# Patient Record
Sex: Male | Born: 1955 | Race: White | Hispanic: No | Marital: Single | Smoking: Never smoker
Health system: Southern US, Community
[De-identification: ages and names within clinical notes are randomized; demographics above are authoritative.]

## PROBLEM LIST (undated history)

## (undated) DIAGNOSIS — I10 Essential (primary) hypertension: Secondary | ICD-10-CM

## (undated) DIAGNOSIS — G8929 Other chronic pain: Secondary | ICD-10-CM

## (undated) DIAGNOSIS — E079 Disorder of thyroid, unspecified: Secondary | ICD-10-CM

## (undated) DIAGNOSIS — B182 Chronic viral hepatitis C: Secondary | ICD-10-CM

---

## 2005-06-29 ENCOUNTER — Emergency Department (HOSPITAL_COMMUNITY): Admission: EM | Admit: 2005-06-29 | Discharge: 2005-06-29 | Payer: Self-pay | Admitting: Emergency Medicine

## 2008-06-03 ENCOUNTER — Emergency Department (HOSPITAL_BASED_OUTPATIENT_CLINIC_OR_DEPARTMENT_OTHER): Admission: EM | Admit: 2008-06-03 | Discharge: 2008-06-04 | Payer: Self-pay | Admitting: Emergency Medicine

## 2008-06-03 ENCOUNTER — Ambulatory Visit: Payer: Self-pay | Admitting: Diagnostic Radiology

## 2009-01-23 DIAGNOSIS — R760 Raised antibody titer: Secondary | ICD-10-CM | POA: Insufficient documentation

## 2009-03-11 ENCOUNTER — Ambulatory Visit: Payer: Self-pay | Admitting: Gastroenterology

## 2009-04-05 ENCOUNTER — Ambulatory Visit (HOSPITAL_COMMUNITY): Admission: RE | Admit: 2009-04-05 | Discharge: 2009-04-05 | Payer: Self-pay | Admitting: Gastroenterology

## 2010-01-07 ENCOUNTER — Ambulatory Visit: Payer: Self-pay | Admitting: Diagnostic Radiology

## 2010-01-07 ENCOUNTER — Inpatient Hospital Stay (HOSPITAL_COMMUNITY): Admission: EM | Admit: 2010-01-07 | Discharge: 2010-01-14 | Payer: Self-pay | Admitting: Emergency Medicine

## 2010-01-07 ENCOUNTER — Encounter: Payer: Self-pay | Admitting: Emergency Medicine

## 2010-01-13 ENCOUNTER — Ambulatory Visit: Payer: Self-pay | Admitting: Physical Medicine & Rehabilitation

## 2010-05-16 ENCOUNTER — Encounter (HOSPITAL_COMMUNITY)
Admission: RE | Admit: 2010-05-16 | Discharge: 2010-05-16 | Disposition: A | Discharge status: Home / Self Care | Payer: Self-pay | Source: Ambulatory Visit | Attending: Orthopedic Surgery | Admitting: Orthopedic Surgery

## 2010-05-16 DIAGNOSIS — Z01812 Encounter for preprocedural laboratory examination: Secondary | ICD-10-CM | POA: Insufficient documentation

## 2010-05-16 LAB — COMPREHENSIVE METABOLIC PANEL
ALT: 49 U/L (ref 0–53)
AST: 60 U/L — ABNORMAL HIGH (ref 0–37)
Albumin: 3.7 g/dL (ref 3.5–5.2)
Alkaline Phosphatase: 84 U/L (ref 39–117)
CO2: 27 mEq/L (ref 19–32)
Calcium: 9.2 mg/dL (ref 8.4–10.5)
Chloride: 104 mEq/L (ref 96–112)
Creatinine, Ser: 1.44 mg/dL (ref 0.4–1.5)
GFR calc Af Amer: >60 mL/min (ref >60)
GFR calc non Af Amer: 52 mL/min — ABNORMAL LOW (ref >60)
Glucose, Bld: 111 mg/dL — ABNORMAL HIGH (ref 70–99)
Potassium: 4.1 mEq/L (ref 3.5–5.1)
Sodium: 139 mEq/L (ref 135–145)
Total Bilirubin: 1 mg/dL (ref 0.3–1.2)
Total Protein: 6.5 g/dL (ref 6.0–8.3)

## 2010-05-16 LAB — CBC
HCT: 39 % (ref 39.0–52.0)
Hemoglobin: 14 g/dL (ref 13.0–17.0)
MCH: 31.3 pg (ref 26.0–34.0)
MCHC: 35.9 g/dL (ref 30.0–36.0)
Platelets: 111 10*3/uL — ABNORMAL LOW (ref 150–400)
RBC: 4.47 MIL/uL (ref 4.22–5.81)
RDW: 13.1 % (ref 11.5–15.5)

## 2010-05-16 LAB — APTT: aPTT: 32 seconds (ref 24–37)

## 2010-05-16 LAB — URINALYSIS, ROUTINE W REFLEX MICROSCOPIC
Hgb urine dipstick: NEGATIVE
Ketones, ur: 15 mg/dL — AB
Protein, ur: NEGATIVE mg/dL
Specific Gravity, Urine: 1.022 (ref 1.005–1.030)
Urine Glucose, Fasting: NEGATIVE mg/dL
Urobilinogen, UA: 4 mg/dL — ABNORMAL HIGH (ref 0.0–1.0)

## 2010-05-16 LAB — SURGICAL PCR SCREEN: Staphylococcus aureus: POSITIVE — AB

## 2010-05-16 LAB — BILIRUBIN, DIRECT: Bilirubin, Direct: 0.2 mg/dL (ref 0.0–0.3)

## 2010-05-16 LAB — PROTIME-INR
INR: 1.05 (ref 0.00–1.49)
Prothrombin Time: 13.9 seconds (ref 11.6–15.2)

## 2010-05-19 ENCOUNTER — Ambulatory Visit (HOSPITAL_COMMUNITY)
Admission: RE | Admit: 2010-05-19 | Discharge: 2010-05-19 | Disposition: A | Discharge status: Home / Self Care | Payer: Self-pay | Source: Ambulatory Visit | Attending: Orthopedic Surgery | Admitting: Orthopedic Surgery

## 2010-05-19 DIAGNOSIS — Z472 Encounter for removal of internal fixation device: Secondary | ICD-10-CM | POA: Insufficient documentation

## 2010-06-06 NOTE — Op Note (Signed)
William Robles, William Robles                ACCOUNT NO.:  [id number]  MEDICAL RECORD NO.:  [id number]           PATIENT TYPE:  O  LOCATION:  SDSC                         FACILITY:  [facility]  PHYSICIAN:  Vania Rea. Ashlynne Shetterly, M.D.  DATE OF BIRTH:  12-Jul-1955  DATE OF PROCEDURE:  05/19/2010 DATE OF DISCHARGE:  05/19/2010                              OPERATIVE REPORT   PREOPERATIVE DIAGNOSIS:  Retained hardware, right shoulder.  POSTOPERATIVE DIAGNOSIS:  Retained hardware, right shoulder.  PROCEDURE:  Hardware removal right shoulder in the form of a coracoclavicular screw.  SURGEON:  Vania Rea. Jaslin Novitski, MD  ASSISTANT:  Lucita Lora. Shuford, PA-C  ANESTHESIA:  LMA general as well as local.  BLOOD LOSS:  Minimal.  DRAINS:  None.  HISTORY:  [name] is a 55 year old gentleman who sustained a previous severely displaced distal clavicle fracture and underwent an open reduction and internal fixation with a coracoclavicular screw that I performed last fall.  He presents for follow up with complaints of pain related to retained hardware and is brought to the operating room at this time for planned hardware removal.  His radiographs do show some continued lucencies of the distal clavicle fracture site, but clinically it does appear to have solid union the region with no gross instability. In addition, there is some evidence for lysis around the implant suggesting that it has indeed become loose, and he is brought to the operating room at this time for planned hardware removal.  Preoperatively, I had counseled Mr. Schaner on treatment options as well as risks versus benefits thereof.  Possible surgical complications were reviewed including the potential for bleeding, infection, neurovascular injury, persistent pain, loss of motion, anesthetic complication, refracture, further displacement at the repair site, and possible need for additional surgery.  He understands and accepts and agrees with our planned  procedure.  PROCEDURE IN DETAIL:  After undergoing routine preop evaluation, the patient received prophylactic antibiotics.  Placed supine on the operating table, underwent smooth induction of an LMA general anesthesia.  The right shoulder girdle region was sterilely prepped and draped in standard fashion.  Time-out was called.  At this point, the proximal aspect of the skin incision overlying the distal clavicle was infiltrated with 4 mL of 0.5% Marcaine with epinephrine.  The proximal portion of the incision was then opened 2 cm with sharp dissection, carried deeply down to the dorsal aspect of the distal clavicle. Subperiosteal dissection was then performed and readily identified the coracoclavicular screw and the associated washer which were clearly loose.  These were then removed without difficulty utilizing the appropriate DePuy screwdriver.  The wound was then irrigated. Hemostasis was obtained.  Closed with 2-0 Vicryl in the subcu layer and intracuticular 3-0 Monocryl for the skin followed by Steri-Strips.  Additional 0.5% Marcaine with epinephrine was instilled in the peri-incisional soft tissues.  Dry dressing was applied.  The patient was then awakened, extubated, and taken to the recovery room in stable condition.     Vania Rea. Italia Wolfert, M.D.     KMS/MEDQ  D:  05/19/2010  T:  05/20/2010  Job:  [id number]  Electronically Signed  by Caryn Bee Chrsitopher Wik M.D. on 06/06/2010 05:03:03 PM

## 2010-06-16 LAB — URINALYSIS, ROUTINE W REFLEX MICROSCOPIC
Bilirubin Urine: NEGATIVE
Glucose, UA: NEGATIVE mg/dL
Hgb urine dipstick: NEGATIVE
Ketones, ur: NEGATIVE mg/dL
Nitrite: NEGATIVE
Protein, ur: NEGATIVE mg/dL
Specific Gravity, Urine: 1.027 (ref 1.005–1.030)
Urobilinogen, UA: 0.2 mg/dL (ref 0.0–1.0)
pH: 7 (ref 5.0–8.0)

## 2010-06-16 LAB — CBC
HCT: 36.4 % — ABNORMAL LOW (ref 39.0–52.0)
HCT: 37.7 % — ABNORMAL LOW (ref 39.0–52.0)
HCT: 42.6 % (ref 39.0–52.0)
Hemoglobin: 12.7 g/dL — ABNORMAL LOW (ref 13.0–17.0)
Hemoglobin: 13.6 g/dL (ref 13.0–17.0)
Hemoglobin: 14.5 g/dL (ref 13.0–17.0)
MCH: 30.9 pg (ref 26.0–34.0)
MCH: 30.9 pg (ref 26.0–34.0)
MCH: 31.3 pg (ref 26.0–34.0)
MCHC: 34 g/dL (ref 30.0–36.0)
MCHC: 34.9 g/dL (ref 30.0–36.0)
MCHC: 36.1 g/dL — ABNORMAL HIGH (ref 30.0–36.0)
MCV: 86.7 fL (ref 78.0–100.0)
MCV: 88.6 fL (ref 78.0–100.0)
MCV: 91 fL (ref 78.0–100.0)
Platelets: 130 10*3/uL — ABNORMAL LOW (ref 150–400)
Platelets: 136 10*3/uL — ABNORMAL LOW (ref 150–400)
Platelets: 92 10*3/uL — ABNORMAL LOW (ref 150–400)
RBC: 4.11 MIL/uL — ABNORMAL LOW (ref 4.22–5.81)
RBC: 4.35 MIL/uL (ref 4.22–5.81)
RBC: 4.69 MIL/uL (ref 4.22–5.81)
RDW: 13.1 % (ref 11.5–15.5)
RDW: 13.6 % (ref 11.5–15.5)
RDW: 13.8 % (ref 11.5–15.5)
WBC: 6.3 10*3/uL (ref 4.0–10.5)
WBC: 6.6 10*3/uL (ref 4.0–10.5)
WBC: 7.3 10*3/uL (ref 4.0–10.5)

## 2010-06-16 LAB — BASIC METABOLIC PANEL
BUN: 11 mg/dL (ref 6–23)
CO2: 29 mEq/L (ref 19–32)
Calcium: 9.1 mg/dL (ref 8.4–10.5)
Chloride: 101 mEq/L (ref 96–112)
Creatinine, Ser: 1.19 mg/dL (ref 0.4–1.5)
GFR calc Af Amer: >60 mL/min (ref >60)
GFR calc non Af Amer: >60 mL/min (ref >60)
Glucose, Bld: 101 mg/dL — ABNORMAL HIGH (ref 70–99)
Potassium: 4.1 mEq/L (ref 3.5–5.1)
Sodium: 138 mEq/L (ref 135–145)

## 2010-06-16 LAB — COMPREHENSIVE METABOLIC PANEL
ALT: 53 U/L (ref 0–53)
AST: 71 U/L — ABNORMAL HIGH (ref 0–37)
Albumin: 4 g/dL (ref 3.5–5.2)
Alkaline Phosphatase: 96 U/L (ref 39–117)
BUN: 14 mg/dL (ref 6–23)
CO2: 28 mEq/L (ref 19–32)
Calcium: 9.5 mg/dL (ref 8.4–10.5)
Chloride: 104 mEq/L (ref 96–112)
Creatinine, Ser: 1 mg/dL (ref 0.4–1.5)
GFR calc Af Amer: >60 mL/min (ref >60)
GFR calc non Af Amer: >60 mL/min (ref >60)
Glucose, Bld: 131 mg/dL — ABNORMAL HIGH (ref 70–99)
Potassium: 4.4 mEq/L (ref 3.5–5.1)
Sodium: 139 mEq/L (ref 135–145)
Total Bilirubin: 1.3 mg/dL — ABNORMAL HIGH (ref 0.3–1.2)
Total Protein: 7.6 g/dL (ref 6.0–8.3)

## 2010-06-30 ENCOUNTER — Emergency Department (HOSPITAL_BASED_OUTPATIENT_CLINIC_OR_DEPARTMENT_OTHER)
Admission: EM | Admit: 2010-06-30 | Discharge: 2010-06-30 | Disposition: A | Discharge status: Home / Self Care | Attending: Emergency Medicine | Admitting: Emergency Medicine

## 2010-06-30 ENCOUNTER — Emergency Department (INDEPENDENT_AMBULATORY_CARE_PROVIDER_SITE_OTHER)

## 2010-06-30 DIAGNOSIS — M25519 Pain in unspecified shoulder: Secondary | ICD-10-CM | POA: Insufficient documentation

## 2010-06-30 DIAGNOSIS — W06XXXA Fall from bed, initial encounter: Secondary | ICD-10-CM | POA: Insufficient documentation

## 2010-06-30 DIAGNOSIS — T148XXA Other injury of unspecified body region, initial encounter: Secondary | ICD-10-CM | POA: Insufficient documentation

## 2010-06-30 DIAGNOSIS — Y9210 Unspecified residential institution as the place of occurrence of the external cause: Secondary | ICD-10-CM | POA: Insufficient documentation

## 2010-07-14 LAB — CBC
HCT: 42.8 % (ref 39.0–52.0)
MCV: 90.3 fL (ref 78.0–100.0)
RBC: 4.74 MIL/uL (ref 4.22–5.81)
WBC: 8.8 10*3/uL (ref 4.0–10.5)

## 2010-07-14 LAB — DIFFERENTIAL
Eosinophils Absolute: 0.1 10*3/uL (ref 0.0–0.7)
Lymphocytes Relative: 22 % (ref 12–46)
Lymphs Abs: 1.9 10*3/uL (ref 0.7–4.0)
Monocytes Relative: 10 % (ref 3–12)
Neutrophils Relative %: 67 % (ref 43–77)

## 2010-07-14 LAB — BASIC METABOLIC PANEL
BUN: 24 mg/dL — ABNORMAL HIGH (ref 6–23)
CO2: 23 mEq/L (ref 19–32)
Calcium: 9.1 mg/dL (ref 8.4–10.5)
Chloride: 107 mEq/L (ref 96–112)
Creatinine, Ser: 1.5 mg/dL (ref 0.4–1.5)
GFR calc Af Amer: 60 mL/min — ABNORMAL LOW (ref >60)
GFR calc non Af Amer: 50 mL/min — ABNORMAL LOW (ref >60)
Glucose, Bld: 96 mg/dL (ref 70–99)
Potassium: 5.3 mEq/L — ABNORMAL HIGH (ref 3.5–5.1)
Sodium: 143 mEq/L (ref 135–145)

## 2010-07-14 LAB — POCT TOXICOLOGY PANEL: Barbiturates: POSITIVE

## 2010-07-14 LAB — ETHANOL: Alcohol, Ethyl (B): <5 mg/dL (ref 0–10)

## 2010-07-14 LAB — ACETAMINOPHEN LEVEL: Acetaminophen (Tylenol), Serum: <10 ug/mL — ABNORMAL LOW (ref 10–30)

## 2010-07-14 LAB — GLUCOSE, CAPILLARY

## 2011-01-02 DEATH — deceased

## 2011-09-07 ENCOUNTER — Ambulatory Visit: Admitting: Gastroenterology

## 2011-10-19 ENCOUNTER — Ambulatory Visit (INDEPENDENT_AMBULATORY_CARE_PROVIDER_SITE_OTHER): Payer: Self-pay | Admitting: Gastroenterology

## 2011-10-19 DIAGNOSIS — B182 Chronic viral hepatitis C: Secondary | ICD-10-CM

## 2011-10-19 LAB — PROTIME-INR
INR: 1.03 (ref <1.50)
Prothrombin Time: 13.9 seconds (ref 11.6–15.2)

## 2011-10-20 LAB — HEPATITIS B SURFACE ANTIBODY,QUALITATIVE: Hep B S Ab: NEGATIVE

## 2011-10-20 LAB — HEPATITIS B CORE ANTIBODY, TOTAL: Hep B Core Total Ab: POSITIVE — AB

## 2011-10-20 LAB — HEPATITIS A ANTIBODY, TOTAL: Hep A Total Ab: NEGATIVE

## 2011-10-25 LAB — HEPATITIS C GENOTYPE
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
## 2012-03-18 ENCOUNTER — Emergency Department (HOSPITAL_BASED_OUTPATIENT_CLINIC_OR_DEPARTMENT_OTHER)
Admission: EM | Admit: 2012-03-18 | Discharge: 2012-03-18 | Disposition: A | Discharge status: Home / Self Care | Payer: No Typology Code available for payment source | Attending: Emergency Medicine | Admitting: Emergency Medicine

## 2012-03-18 ENCOUNTER — Emergency Department (HOSPITAL_BASED_OUTPATIENT_CLINIC_OR_DEPARTMENT_OTHER): Payer: No Typology Code available for payment source

## 2012-03-18 ENCOUNTER — Encounter (HOSPITAL_BASED_OUTPATIENT_CLINIC_OR_DEPARTMENT_OTHER): Payer: Self-pay | Admitting: *Deleted

## 2012-03-18 DIAGNOSIS — S42202A Unspecified fracture of upper end of left humerus, initial encounter for closed fracture: Secondary | ICD-10-CM

## 2012-03-18 DIAGNOSIS — E079 Disorder of thyroid, unspecified: Secondary | ICD-10-CM | POA: Insufficient documentation

## 2012-03-18 DIAGNOSIS — Y939 Activity, unspecified: Secondary | ICD-10-CM | POA: Insufficient documentation

## 2012-03-18 DIAGNOSIS — Z79899 Other long term (current) drug therapy: Secondary | ICD-10-CM | POA: Insufficient documentation

## 2012-03-18 DIAGNOSIS — Y929 Unspecified place or not applicable: Secondary | ICD-10-CM | POA: Insufficient documentation

## 2012-03-18 DIAGNOSIS — R000 Tachycardia, unspecified: Secondary | ICD-10-CM | POA: Insufficient documentation

## 2012-03-18 DIAGNOSIS — S42209A Unspecified fracture of upper end of unspecified humerus, initial encounter for closed fracture: Secondary | ICD-10-CM | POA: Insufficient documentation

## 2012-03-18 DIAGNOSIS — I10 Essential (primary) hypertension: Secondary | ICD-10-CM | POA: Insufficient documentation

## 2012-03-18 DIAGNOSIS — W1789XA Other fall from one level to another, initial encounter: Secondary | ICD-10-CM | POA: Insufficient documentation

## 2012-03-18 HISTORY — DX: Disorder of thyroid, unspecified: E07.9

## 2012-03-18 HISTORY — DX: Essential (primary) hypertension: I10

## 2012-03-18 MED ORDER — MORPHINE SULFATE 4 MG/ML IJ SOLN
4.0000 mg | Freq: Once | INTRAMUSCULAR | Status: AC
  Administered 2012-03-18: 4 mg via INTRAVENOUS
  Filled 2012-03-18: qty 1

## 2012-03-18 MED ORDER — HYDROMORPHONE HCL PF 1 MG/ML IJ SOLN
INTRAMUSCULAR | Status: AC
  Filled 2012-03-18: qty 1

## 2012-03-18 MED ORDER — HYDROMORPHONE HCL PF 1 MG/ML IJ SOLN
1.0000 mg | Freq: Once | INTRAMUSCULAR | Status: AC
  Administered 2012-03-18: 1 mg via INTRAVENOUS
  Filled 2012-03-18: qty 1

## 2012-03-18 MED ORDER — HYDROMORPHONE HCL PF 1 MG/ML IJ SOLN
1.0000 mg | Freq: Once | INTRAMUSCULAR | Status: AC
  Administered 2012-03-18: 1 mg via INTRAVENOUS

## 2012-03-18 MED ORDER — DIAZEPAM 5 MG/ML IJ SOLN
5.0000 mg | Freq: Once | INTRAMUSCULAR | Status: AC
  Administered 2012-03-18: 5 mg via INTRAVENOUS
  Filled 2012-03-18: qty 2

## 2012-03-18 MED ORDER — OXYCODONE-ACETAMINOPHEN 5-325 MG PO TABS: ORAL_TABLET | ORAL | Status: AC

## 2012-03-18 NOTE — ED Notes (Signed)
Pt c/o fall from roof c/o left shoulder pain x 30 mins ago

## 2012-03-18 NOTE — ED Notes (Signed)
Patient transported to CT 

## 2012-03-18 NOTE — ED Provider Notes (Addendum)
History   This chart was scribed for Gavin Pound. Oletta Lamas, MD, by Frederik Pear, ER scribe. The patient was seen in room MHT14/MHT14 and the patient's care was started at 1641.    CSN: [id number]  Arrival date & time 03/18/12  1621   First MD Initiated Contact with Patient 03/18/12 1641      Chief Complaint  Patient presents with  . Fall    (Consider location/radiation/quality/duration/timing/severity/associated sxs/prior treatment) HPI Comments: William Robles is a 56 y.o. male who presents to the Emergency Department complaining of left shoulder pain that began after he fell from an 15 ft roof at about 15:45. He denies any LOC or impact to his head. He states that he is right handed. He denies any allergies to medication. He has a h/o of hypertension and thyroid disease. He is a Level 5 Caveat due to severe pain.  No neck pain, back pain, abd pain.   No SOB.  No pain to pelvis, knees, legs, hips.  No LOC.    Orthopaedist is Dr. Rennis Chris with [facility].  The history is provided by the patient.    Past Medical History  Diagnosis Date  . Hypertension   . Thyroid disease     History reviewed. No pertinent past surgical history.  History reviewed. No pertinent family history.  History  Substance Use Topics  . Smoking status: Never Smoker   . Smokeless tobacco: Not on file  . Alcohol Use: No      Review of Systems  Unable to perform ROS: Unstable vital signs    Allergies  Review of patient's allergies indicates no known allergies.  Home Medications   Current Outpatient Rx  Name  Route  Sig  Dispense  Refill  . HYDROCHLOROTHIAZIDE 25 MG PO TABS   Oral   Take 25 mg by mouth daily.         Marland Kitchen LEVOTHYROXINE SODIUM 25 MCG PO TABS   Oral   Take 25 mcg by mouth daily.         . OXYCODONE-ACETAMINOPHEN 5-325 MG PO TABS      1-2 tablets po q 6 hours prn moderate to severe pain   20 tablet   0     BP 145/81  Pulse 112  Temp 98.5 F (36.9 C) (Oral)   Resp 18  Ht 6\' 1"  (1.854 m)  Wt 180 lb (81.647 kg)  BMI 23.75 kg/m2  SpO2 98%  Physical Exam  Nursing note and vitals reviewed. Constitutional: He is oriented to person, place, and time. He appears well-developed and well-nourished. He appears distressed.  HENT:  Head: Normocephalic and atraumatic.  Eyes: Pupils are equal, round, and reactive to light.  Neck: Normal range of motion.  Cardiovascular: Regular rhythm, intact distal pulses and normal pulses.  Tachycardia present.   Pulmonary/Chest: Effort normal and breath sounds normal. He has no wheezes. He has no rales.       He is tachypneaic, but no accessory muscle use.   Abdominal: Soft. Bowel sounds are normal. There is no tenderness.  Musculoskeletal: He exhibits tenderness.       Left shoulder: He exhibits decreased range of motion, tenderness, swelling, crepitus and spasm. He exhibits no deformity, no laceration, normal pulse and normal strength.       His CT and L-spine are non-tender.   Neurological: He is alert and oriented to person, place, and time. No cranial nerve deficit. He exhibits normal muscle tone. Coordination normal.  Distal sensation intact left UE, gross sensation intact left shoulder.  Gross motor function of distal left UE intact  Skin: Skin is warm.    ED Course  Procedures (including critical care time)  DIAGNOSTIC STUDIES: Oxygen Saturation is 100% on room air, normal by my interpretation.    COORDINATION OF CARE:  16:53- Discussed planned course of treatment with the patient, who is agreeable at this time.  18:35- Consult with Dr. Dr. Rennis Chris.  Labs Reviewed - No data to display Ct Shoulder Left Wo Contrast  03/18/2012  *RADIOLOGY REPORT*  Clinical Data: Comminuted proximal humerus fracture and possible glenoid avulsion fracture on radiographs earlier today.  Left shoulder pain following a fall.  CT OF THE LEFT SHOULDER WITHOUT CONTRAST  Technique:  Multidetector CT imaging was performed  according to the standard protocol. Multiplanar CT image reconstructions were also generated.  Comparison: Radiographs obtained earlier today.  Findings: Comminuted, impacted humeral neck fracture extending into the distal humeral head and through the greater tuberosity.  Mild medial displacement of the distal fragment without significant angulation.  Inferior glenohumeral spur formation.  No glenoid fracture is seen.  No dislocation.  IMPRESSION:  1.  Comminuted, mildly impacted left humeral head and neck fracture, as described above. 2.  No glenoid fracture or dislocation. 3.  Inferior glenohumeral degenerative spur formation.   Original Report Authenticated By: Beckie Salts, M.D.    Dg Shoulder Left  03/18/2012  *RADIOLOGY REPORT*  Clinical Data: Pain post trauma  LEFT SHOULDER - 2+ VIEW  Comparison: None.  Findings:  Internal rotation transthoracic, and Y scapular views were obtained.  There is a comminuted fracture of the proximal humeral metaphysis with several displaced fragments and impaction at the fracture site.  There is a probable avulsion off the inferior glenoid as well.  No other fractures No dislocation. There is narrowing of the glenohumeral joint.  IMPRESSION:  Comminuted fracture proximal humeral metaphysis. Avulsion off the inferior glenoid.  No dislocation.   Original Report Authenticated By: Bretta Bang, M.D.    I reviewed the 3 view left shoulder series myself, showing comminuted fracture of proximal humerus.  No dislocation.    1. Closed fracture of left proximal humerus    7:48 PM Spoke to Dr. Ranell Patrick and then [facility] and pt's care discussed.  CT was done per Dr. Ranell Patrick request.  Pt would like to be discharged rather than admitted.  Pt's pain seems improved, tolerating sling.  Distal cap refill, pulses and gross sensation is intact.  Orthopedic office to call pt tomorrow.     MDM  I personally performed the services described in this documentation, which was scribed in my  presence. The recorded information has been reviewed and is accurate.    Pt will need pain control, sling immobilizer and referral back to Dr. Rennis Chris.  Serial exams of abdomen reveals non tender abd, soft, no guard or rebound. Initial dose of dilaudid did not improve pain, although pt seems much more relaxed. Will give additional as well as IV valium for muscle spasms.          Gavin Pound. Oletta Lamas, MD 03/18/12 1946  Gavin Pound. Oletta Lamas, MD 03/18/12 2841

## 2012-03-18 NOTE — Discharge Instructions (Signed)
 Shoulder Fracture You have a fractured humerus (bone in the upper arm) at the shoulder just below the ball of the shoulder joint. Most of the time the bones of a broken shoulder are in an acceptable position. Usually the injury can be treated with a shoulder immobilizer or sling and swath bandage. These devices support the arm and prevent any shoulder movement. If the bones are not in a good position, then surgery is sometimes needed. Shoulder fractures usually cause swelling, pain, and discoloration around the upper arm initially. They heal in 8-12 weeks with proper treatment. Rest in bed or a reclining chair as long as your shoulder is very painful. Sitting up generally results in less pain at the fracture site. Do not remove your shoulder bandage until your caregiver approves. You may apply ice packs over the shoulder for 20-30 minutes every 2 hours for the next 2-3 days to reduce the pain and swelling. Use your pain medicine as prescribed.  SEEK IMMEDIATE MEDICAL CARE IF:  You develop severe shoulder pain unrelieved by rest and taking pain medicine.  You have pain, numbness, tingling, or weakness in the hand or wrist.  You develop shortness of breath, chest pain, severe weakness, or fainting.  You have severe pain with motion of the fingers or wrist. MAKE SURE YOU:   Understand these instructions.  Will watch your condition.  Will get help right away if you are not doing well or get worse. Document Released: 04/27/2004 Document Revised: 06/12/2011 Document Reviewed: 07/08/2008 [facility] Patient Information 2013 [place], [place].   Narcotic and benzodiazepine use may cause drowsiness, slowed breathing or dependence.  Please use with caution and do not drive, operate machinery or watch young children alone while taking them.  Taking combinations of these medications or drinking alcohol will potentiate these effects.

## 2012-03-18 NOTE — ED Notes (Signed)
Pt. Has had sling placed and is positioned in bed for comfort.

## 2012-03-18 NOTE — ED Notes (Signed)
Pt. Wanting more pain med before sling placed.  RN to ask EDP.

## 2012-06-12 ENCOUNTER — Ambulatory Visit: Payer: Self-pay | Attending: Orthopedic Surgery | Admitting: Physical Therapy

## 2012-06-12 DIAGNOSIS — M25619 Stiffness of unspecified shoulder, not elsewhere classified: Secondary | ICD-10-CM | POA: Insufficient documentation

## 2012-06-12 DIAGNOSIS — IMO0001 Reserved for inherently not codable concepts without codable children: Secondary | ICD-10-CM | POA: Insufficient documentation

## 2012-06-12 DIAGNOSIS — M25519 Pain in unspecified shoulder: Secondary | ICD-10-CM | POA: Insufficient documentation

## 2012-06-14 ENCOUNTER — Ambulatory Visit: Payer: Self-pay | Admitting: Rehabilitation

## 2012-06-17 ENCOUNTER — Ambulatory Visit: Payer: Self-pay | Admitting: Rehabilitation

## 2012-06-19 ENCOUNTER — Ambulatory Visit: Payer: Self-pay | Admitting: Rehabilitation

## 2012-06-21 ENCOUNTER — Ambulatory Visit: Payer: Self-pay | Admitting: Rehabilitation

## 2012-06-24 ENCOUNTER — Ambulatory Visit: Payer: Self-pay | Admitting: Physical Therapy

## 2012-06-26 ENCOUNTER — Ambulatory Visit: Payer: Self-pay | Admitting: Rehabilitation

## 2012-06-28 ENCOUNTER — Ambulatory Visit: Payer: Self-pay | Admitting: Physical Therapy

## 2012-07-01 ENCOUNTER — Ambulatory Visit: Payer: Self-pay | Admitting: Physical Therapy

## 2012-07-03 ENCOUNTER — Ambulatory Visit: Payer: Self-pay | Attending: Orthopedic Surgery | Admitting: Rehabilitation

## 2012-07-03 DIAGNOSIS — M25519 Pain in unspecified shoulder: Secondary | ICD-10-CM | POA: Insufficient documentation

## 2012-07-03 DIAGNOSIS — IMO0001 Reserved for inherently not codable concepts without codable children: Secondary | ICD-10-CM | POA: Insufficient documentation

## 2012-07-03 DIAGNOSIS — M25619 Stiffness of unspecified shoulder, not elsewhere classified: Secondary | ICD-10-CM | POA: Insufficient documentation

## 2012-07-05 ENCOUNTER — Encounter: Payer: Self-pay | Admitting: Physical Therapy

## 2012-07-08 ENCOUNTER — Encounter: Payer: Self-pay | Admitting: Physical Therapy

## 2012-07-10 ENCOUNTER — Encounter: Payer: Self-pay | Admitting: Rehabilitation

## 2014-01-12 DIAGNOSIS — K7400 Hepatic fibrosis, unspecified: Secondary | ICD-10-CM | POA: Insufficient documentation

## 2014-04-19 DIAGNOSIS — M19012 Primary osteoarthritis, left shoulder: Secondary | ICD-10-CM | POA: Insufficient documentation

## 2014-06-24 DIAGNOSIS — F10251 Alcohol dependence with alcohol-induced psychotic disorder with hallucinations: Secondary | ICD-10-CM | POA: Insufficient documentation

## 2014-12-20 DIAGNOSIS — E538 Deficiency of other specified B group vitamins: Secondary | ICD-10-CM | POA: Insufficient documentation

## 2014-12-20 DIAGNOSIS — F411 Generalized anxiety disorder: Secondary | ICD-10-CM | POA: Insufficient documentation

## 2015-01-15 DIAGNOSIS — G894 Chronic pain syndrome: Secondary | ICD-10-CM | POA: Insufficient documentation

## 2015-02-13 DIAGNOSIS — F450 Somatization disorder: Secondary | ICD-10-CM | POA: Insufficient documentation

## 2015-02-13 DIAGNOSIS — G249 Dystonia, unspecified: Secondary | ICD-10-CM | POA: Insufficient documentation

## 2015-02-14 DIAGNOSIS — R251 Tremor, unspecified: Secondary | ICD-10-CM | POA: Insufficient documentation

## 2016-03-18 DIAGNOSIS — F17210 Nicotine dependence, cigarettes, uncomplicated: Secondary | ICD-10-CM | POA: Insufficient documentation

## 2019-03-20 ENCOUNTER — Encounter (HOSPITAL_BASED_OUTPATIENT_CLINIC_OR_DEPARTMENT_OTHER): Payer: Self-pay | Admitting: Emergency Medicine

## 2019-03-20 ENCOUNTER — Emergency Department (HOSPITAL_BASED_OUTPATIENT_CLINIC_OR_DEPARTMENT_OTHER): Payer: Medicaid Other

## 2019-03-20 ENCOUNTER — Other Ambulatory Visit: Payer: Self-pay

## 2019-03-20 ENCOUNTER — Emergency Department (HOSPITAL_BASED_OUTPATIENT_CLINIC_OR_DEPARTMENT_OTHER)
Admission: EM | Admit: 2019-03-20 | Discharge: 2019-03-20 | Disposition: A | Discharge status: Home / Self Care | Payer: Medicaid Other | Attending: Emergency Medicine | Admitting: Emergency Medicine

## 2019-03-20 DIAGNOSIS — I10 Essential (primary) hypertension: Secondary | ICD-10-CM | POA: Insufficient documentation

## 2019-03-20 DIAGNOSIS — F10920 Alcohol use, unspecified with intoxication, uncomplicated: Secondary | ICD-10-CM | POA: Insufficient documentation

## 2019-03-20 DIAGNOSIS — Y9389 Activity, other specified: Secondary | ICD-10-CM | POA: Diagnosis not present

## 2019-03-20 DIAGNOSIS — S99922A Unspecified injury of left foot, initial encounter: Secondary | ICD-10-CM | POA: Diagnosis not present

## 2019-03-20 DIAGNOSIS — Y9289 Other specified places as the place of occurrence of the external cause: Secondary | ICD-10-CM | POA: Insufficient documentation

## 2019-03-20 DIAGNOSIS — S0990XA Unspecified injury of head, initial encounter: Secondary | ICD-10-CM | POA: Diagnosis not present

## 2019-03-20 DIAGNOSIS — S8992XA Unspecified injury of left lower leg, initial encounter: Secondary | ICD-10-CM | POA: Diagnosis present

## 2019-03-20 DIAGNOSIS — W500XXA Accidental hit or strike by another person, initial encounter: Secondary | ICD-10-CM | POA: Diagnosis not present

## 2019-03-20 DIAGNOSIS — Z23 Encounter for immunization: Secondary | ICD-10-CM | POA: Diagnosis not present

## 2019-03-20 DIAGNOSIS — Y999 Unspecified external cause status: Secondary | ICD-10-CM | POA: Diagnosis not present

## 2019-03-20 HISTORY — DX: Chronic viral hepatitis C: B18.2

## 2019-03-20 HISTORY — DX: Other chronic pain: G89.29

## 2019-03-20 LAB — CBC
HCT: 30 % — ABNORMAL LOW (ref 39.0–52.0)
Hemoglobin: 10.3 g/dL — ABNORMAL LOW (ref 13.0–17.0)
MCH: 32.9 pg (ref 26.0–34.0)
MCHC: 34.3 g/dL (ref 30.0–36.0)
MCV: 95.8 fL (ref 80.0–100.0)
Platelets: 107 10*3/uL — ABNORMAL LOW (ref 150–400)
RBC: 3.13 MIL/uL — ABNORMAL LOW (ref 4.22–5.81)
RDW: 13.1 % (ref 11.5–15.5)
WBC: 3.2 10*3/uL — ABNORMAL LOW (ref 4.0–10.5)
nRBC: 0 % (ref 0.0–0.2)

## 2019-03-20 LAB — COMPREHENSIVE METABOLIC PANEL
ALT: 14 U/L (ref 0–44)
AST: 30 U/L (ref 15–41)
Albumin: 3.5 g/dL (ref 3.5–5.0)
Alkaline Phosphatase: 44 U/L (ref 38–126)
Anion gap: 11 (ref 5–15)
BUN: 12 mg/dL (ref 8–23)
CO2: 23 mmol/L (ref 22–32)
Calcium: 8.3 mg/dL — ABNORMAL LOW (ref 8.9–10.3)
Chloride: 103 mmol/L (ref 98–111)
Creatinine, Ser: 1.2 mg/dL (ref 0.61–1.24)
GFR calc Af Amer: >60 mL/min (ref >60)
GFR calc non Af Amer: >60 mL/min (ref >60)
Glucose, Bld: 70 mg/dL (ref 70–99)
Potassium: 3.8 mmol/L (ref 3.5–5.1)
Sodium: 137 mmol/L (ref 135–145)
Total Bilirubin: 0.5 mg/dL (ref 0.3–1.2)
Total Protein: 6.3 g/dL — ABNORMAL LOW (ref 6.5–8.1)

## 2019-03-20 LAB — ETHANOL: Alcohol, Ethyl (B): 87 mg/dL — ABNORMAL HIGH (ref <10)

## 2019-03-20 MED ORDER — TETANUS-DIPHTH-ACELL PERTUSSIS 5-2.5-18.5 LF-MCG/0.5 IM SUSP
0.5000 mL | Freq: Once | INTRAMUSCULAR | Status: AC
  Administered 2019-03-20: 0.5 mL via INTRAMUSCULAR
  Filled 2019-03-20: qty 0.5

## 2019-03-20 NOTE — ED Triage Notes (Signed)
Per EMS:  Pt was watching TV and slid down and fell.  EMS states he smells of fresh ETOH use.  Pt c/o left foot, shoulders, and lower back.  VSS
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
## 2019-03-20 NOTE — Discharge Instructions (Addendum)
You were evaluated in the Emergency Department and after careful evaluation, we did not find any emergent condition requiring admission or further testing in the hospital. ° °Your exam/testing today was overall reassuring. ° °Please return to the Emergency Department if you experience any worsening of your condition.  We encourage you to follow up with a primary care provider.  Thank you for allowing us to be a part of your care. ° °

## 2019-03-20 NOTE — ED Notes (Signed)
Pts address [address]

## 2019-03-20 NOTE — ED Notes (Signed)
Patient transported to CT 

## 2019-11-18 ENCOUNTER — Other Ambulatory Visit: Payer: Self-pay

## 2019-11-18 ENCOUNTER — Emergency Department (HOSPITAL_BASED_OUTPATIENT_CLINIC_OR_DEPARTMENT_OTHER)
Admission: EM | Admit: 2019-11-18 | Discharge: 2019-11-18 | Disposition: A | Discharge status: Home / Self Care | Payer: Medicaid Other | Attending: Emergency Medicine | Admitting: Emergency Medicine

## 2019-11-18 ENCOUNTER — Encounter (HOSPITAL_BASED_OUTPATIENT_CLINIC_OR_DEPARTMENT_OTHER): Payer: Self-pay

## 2019-11-18 DIAGNOSIS — Y999 Unspecified external cause status: Secondary | ICD-10-CM | POA: Insufficient documentation

## 2019-11-18 DIAGNOSIS — X509XXA Other and unspecified overexertion or strenuous movements or postures, initial encounter: Secondary | ICD-10-CM | POA: Insufficient documentation

## 2019-11-18 DIAGNOSIS — S99921A Unspecified injury of right foot, initial encounter: Secondary | ICD-10-CM | POA: Diagnosis present

## 2019-11-18 DIAGNOSIS — Z5321 Procedure and treatment not carried out due to patient leaving prior to being seen by health care provider: Secondary | ICD-10-CM | POA: Insufficient documentation

## 2019-11-18 DIAGNOSIS — Y9289 Other specified places as the place of occurrence of the external cause: Secondary | ICD-10-CM | POA: Diagnosis not present

## 2019-11-18 DIAGNOSIS — Y9301 Activity, walking, marching and hiking: Secondary | ICD-10-CM | POA: Diagnosis not present

## 2019-11-18 NOTE — ED Triage Notes (Addendum)
Pt arrives from bank via [facility]. Pt reports he was walking into bank today and had pain to right 4th and 5th toes. Reports continuing to walk into bank when he realized that he was bleeding. Staff from bank assisted him to call EMS. EMS reports right pinky toenail injury, bleeding controlled with rolled gauze around the area. Denies blood thinners. EMS reports ETOH today. Pt reports daily methadone use. Pt also reports 2 beers today around lunch time.

## 2021-01-16 ENCOUNTER — Emergency Department (HOSPITAL_BASED_OUTPATIENT_CLINIC_OR_DEPARTMENT_OTHER): Payer: Medicaid Other

## 2021-01-16 ENCOUNTER — Encounter (HOSPITAL_BASED_OUTPATIENT_CLINIC_OR_DEPARTMENT_OTHER): Payer: Self-pay | Admitting: Emergency Medicine

## 2021-01-16 ENCOUNTER — Other Ambulatory Visit: Payer: Self-pay

## 2021-01-16 ENCOUNTER — Emergency Department (HOSPITAL_BASED_OUTPATIENT_CLINIC_OR_DEPARTMENT_OTHER)
Admission: EM | Admit: 2021-01-16 | Discharge: 2021-01-17 | Disposition: A | Discharge status: Home / Self Care | Payer: Medicaid Other | Attending: Emergency Medicine | Admitting: Emergency Medicine

## 2021-01-16 DIAGNOSIS — W19XXXA Unspecified fall, initial encounter: Secondary | ICD-10-CM | POA: Diagnosis not present

## 2021-01-16 DIAGNOSIS — M25571 Pain in right ankle and joints of right foot: Secondary | ICD-10-CM | POA: Insufficient documentation

## 2021-01-16 DIAGNOSIS — R479 Unspecified speech disturbances: Secondary | ICD-10-CM | POA: Insufficient documentation

## 2021-01-16 DIAGNOSIS — R400 Somnolence: Secondary | ICD-10-CM | POA: Diagnosis not present

## 2021-01-16 DIAGNOSIS — Z79899 Other long term (current) drug therapy: Secondary | ICD-10-CM | POA: Diagnosis not present

## 2021-01-16 DIAGNOSIS — I10 Essential (primary) hypertension: Secondary | ICD-10-CM | POA: Insufficient documentation

## 2021-01-16 DIAGNOSIS — F1099 Alcohol use, unspecified with unspecified alcohol-induced disorder: Secondary | ICD-10-CM | POA: Diagnosis not present

## 2021-01-16 DIAGNOSIS — M546 Pain in thoracic spine: Secondary | ICD-10-CM | POA: Insufficient documentation

## 2021-01-16 LAB — CBC WITH DIFFERENTIAL/PLATELET
Abs Immature Granulocytes: 0.02 10*3/uL (ref 0.00–0.07)
Basophils Absolute: 0 10*3/uL (ref 0.0–0.1)
Basophils Relative: 0 %
Eosinophils Absolute: 0.3 10*3/uL (ref 0.0–0.5)
Eosinophils Relative: 4 %
HCT: 33 % — ABNORMAL LOW (ref 39.0–52.0)
Hemoglobin: 11.8 g/dL — ABNORMAL LOW (ref 13.0–17.0)
Immature Granulocytes: 0 %
Lymphocytes Relative: 29 %
Lymphs Abs: 1.7 10*3/uL (ref 0.7–4.0)
MCH: 31.5 pg (ref 26.0–34.0)
MCHC: 35.8 g/dL (ref 30.0–36.0)
MCV: 88 fL (ref 80.0–100.0)
Monocytes Absolute: 0.6 10*3/uL (ref 0.1–1.0)
Monocytes Relative: 10 %
Neutro Abs: 3.4 10*3/uL (ref 1.7–7.7)
Neutrophils Relative %: 57 %
Platelets: 149 10*3/uL — ABNORMAL LOW (ref 150–400)
RBC: 3.75 MIL/uL — ABNORMAL LOW (ref 4.22–5.81)
RDW: 13.1 % (ref 11.5–15.5)
WBC: 6 10*3/uL (ref 4.0–10.5)
nRBC: 0 % (ref 0.0–0.2)

## 2021-01-16 LAB — COMPREHENSIVE METABOLIC PANEL
ALT: 11 U/L (ref 0–44)
AST: 18 U/L (ref 15–41)
Albumin: 3.7 g/dL (ref 3.5–5.0)
Alkaline Phosphatase: 67 U/L (ref 38–126)
Anion gap: 8 (ref 5–15)
BUN: 17 mg/dL (ref 8–23)
CO2: 28 mmol/L (ref 22–32)
Calcium: 8.9 mg/dL (ref 8.9–10.3)
Chloride: 100 mmol/L (ref 98–111)
Creatinine, Ser: 1.11 mg/dL (ref 0.61–1.24)
GFR, Estimated: >60 mL/min (ref >60)
Glucose, Bld: 92 mg/dL (ref 70–99)
Potassium: 3.5 mmol/L (ref 3.5–5.1)
Sodium: 136 mmol/L (ref 135–145)
Total Bilirubin: 0.3 mg/dL (ref 0.3–1.2)
Total Protein: 6.3 g/dL — ABNORMAL LOW (ref 6.5–8.1)

## 2021-01-16 LAB — MAGNESIUM: Magnesium: 2 mg/dL (ref 1.7–2.4)

## 2021-01-16 LAB — CBG MONITORING, ED: Glucose-Capillary: 84 mg/dL (ref 70–99)

## 2021-01-16 MED ORDER — LACTATED RINGERS IV BOLUS
1000.0000 mL | Freq: Once | INTRAVENOUS | Status: AC
  Administered 2021-01-16: 1000 mL via INTRAVENOUS

## 2021-01-16 MED ORDER — KETOROLAC TROMETHAMINE 60 MG/2ML IM SOLN
30.0000 mg | Freq: Once | INTRAMUSCULAR | Status: AC
  Administered 2021-01-16: 30 mg via INTRAMUSCULAR
  Filled 2021-01-16: qty 2

## 2021-01-16 NOTE — ED Provider Notes (Signed)
[facility] EMERGENCY DEPARTMENT Provider Note   CSN: [id number] Arrival date & time: 01/16/21  1909     History Chief Complaint  Patient presents with   Fall   Withdrawal    William Robles is a 66 y.o. male.   Fall Pertinent negatives include no chest pain, no abdominal pain, no headaches and no shortness of breath. Patient is a 65 year old male with history of homelessness and polysubstance abuse, presenting via EMS for uncertain reasons.  EMS reported that he had a fall.  Following this fall, he endorsed right ankle pain.  Patient states that he does not remember the fall but does endorse right ankle pain.  He states that he has been walking on it.  When asked about other areas of pain, he states that he hurts in the area of his mid back.  There was a report by EMS that he was previously on methadone and stopped 3 weeks ago.  Patient reports drinking alcohol earlier today.  He denies any abdominal pain, nausea, shortness of breath, chest discomfort, or any areas of pain other than his right ankle and mid back.  He states that he is recently been living in a homeless shelter.  He describes the shelter is nice.  There apparently beds spread out and it is a comfortable place.  Because it is a new environment, he has been not sleeping well there.  He does endorse fatigue from recent poor sleep.     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     No family history on file.  Social History   Tobacco Use   Smoking status: Never   Smokeless tobacco: Never  Substance Use Topics   Alcohol use: Yes   Drug use: Yes    Comment: methadone    Home Medications Prior to Admission medications   Medication Sig Start Date End Date Taking? Authorizing Provider  hydrochlorothiazide (HYDRODIURIL) 25 MG tablet Take 25 mg by mouth daily.    [provider]  levothyroxine (SYNTHROID, LEVOTHROID) 25 MCG tablet Take 25 mcg by mouth daily.    [provider]  oxyCODONE-acetaminophen (PERCOCET/ROXICET) 5-325 MG per tablet 1-2 tablets po q 6 hours prn moderate to severe pain 03/18/12   Quita Skye, MD    Allergies    Patient has no known allergies.  Review of Systems   Review of Systems  Constitutional:  Positive for fatigue. Negative for appetite change, chills and fever.  HENT:  Negative for congestion, ear pain and sore throat.   Eyes:  Negative for pain and visual disturbance.  Respiratory:  Negative for cough, chest tightness, shortness of breath and wheezing.   Cardiovascular:  Negative for chest pain and palpitations.  Gastrointestinal:  Negative for abdominal pain, diarrhea, nausea and vomiting.  Genitourinary:  Negative for dysuria, flank pain and hematuria.  Musculoskeletal:  Positive for arthralgias and back pain. Negative for gait problem, myalgias and neck pain.  Skin:  Negative for color change, rash and wound.  Neurological:  Negative for dizziness, seizures, syncope, weakness, light-headedness, numbness and headaches.  All other systems reviewed and are negative.  Physical Exam Updated Vital Signs BP 113/66   Pulse 76   Temp 98.1 F (36.7 C) (Oral)   Resp 10   Ht 6\' 1"  (1.854 m)   Wt 88.5 kg   SpO2 93%   BMI 25.73 kg/m   Physical Exam Vitals and nursing note reviewed.  Constitutional:       General: He is not in acute distress.    Appearance: Normal appearance. He is well-developed. He is not ill-appearing, toxic-appearing or diaphoretic.  HENT:     Head: Normocephalic and atraumatic.     Right Ear: External ear normal.     Left Ear: External ear normal.     Nose: Nose normal.     Mouth/Throat:     Mouth: Mucous membranes are moist.     Pharynx: Oropharynx is clear.  Eyes:     General: No scleral icterus.    Extraocular Movements: Extraocular movements intact.     Conjunctiva/sclera: Conjunctivae normal.  Cardiovascular:     Rate and Rhythm: Normal rate and regular rhythm.     Heart sounds: No murmur heard. Pulmonary:     Effort: Pulmonary effort is normal. No respiratory distress.     Breath sounds: Normal breath sounds. No wheezing or rales.  Abdominal:     Palpations: Abdomen is soft.     Tenderness: There is no abdominal tenderness.  Musculoskeletal:        General: Tenderness present. No deformity. Normal range of motion.     Cervical back: Normal range of motion and neck supple. No rigidity.     Right lower leg: No edema.     Left lower leg: No edema.  Skin:    General: Skin is warm and dry.     Coloration: Skin is not jaundiced or pale.  Neurological:     General: No focal deficit present.     Mental Status: He is alert and oriented to person, place, and time.     Cranial Nerves: No cranial nerve deficit.     Sensory: No sensory deficit.     Motor: No weakness.     Coordination: Coordination normal.  Psychiatric:        Speech: Speech is delayed.        Behavior: Behavior is slowed.    ED Results / Procedures / Treatments   Labs (all labs ordered are listed, but only abnormal results are displayed) Labs Reviewed  COMPREHENSIVE METABOLIC PANEL - Abnormal; Notable for the following components:      Result Value   Total Protein 6.3 (*)    All other components within normal limits  CBC WITH DIFFERENTIAL/PLATELET - Abnormal; Notable for the following  components:   RBC 3.75 (*)    Hemoglobin 11.8 (*)    HCT 33.0 (*)    Platelets 149 (*)    All other components within normal limits  MAGNESIUM  CBG MONITORING, ED    EKG EKG Interpretation  Date/Time:  Sunday January 16 2021 20:26:30 EDT Ventricular Rate:  74 PR Interval:  56 QRS Duration: 110 QT Interval:  427 QTC Calculation: 474 R Axis:   71 Text Interpretation: Sinus rhythm Short PR interval Abnormal inferior Q waves Confirmed by [id number] (  [id number]) on 01/16/2021 9:52:55 PM  Radiology DG Lumbar Spine Complete  Result Date: 01/17/2021 CLINICAL DATA:  Chronic lower back pain. EXAM: LUMBAR SPINE - COMPLETE 4+ VIEW COMPARISON:  CT abdomen and pelvis 01/07/2010. FINDINGS: Mild compression deformities of L4 and L1 appears similar to the prior study. There is 25% compression deformity superior endplate of T12 which is new from 2011, but favored as chronic. Spinal alignment is within normal limits. There is disc space narrowing at L4-L5 and L5-S1 compatible with degenerative change. Mild degenerative endplate osteophytes are seen throughout the lower thoracic and lumbar spine. There are vascular calcifications in the soft tissues. IMPRESSION: 1. No acute fracture or malalignment of the lumbar spine. 2. 25% compression fracture of T12 is new from 2011, but favored as chronic. Please correlate for point tenderness. 3. Mild degenerative changes. Electronically Signed   By: Darliss Cheney M.D.   On: 01/17/2021 23:49   DG Ankle Complete Right  Result Date: 01/16/2021 CLINICAL DATA:  Recent fall with right ankle pain, initial encounter EXAM: RIGHT ANKLE - COMPLETE 3+ VIEW COMPARISON:  None. FINDINGS: Well corticated bony density is noted adjacent to the distal fibula consistent with prior trauma and nonunion. No acute fracture is seen. Tarsal degenerative changes and calcaneal spurring is seen. No soft tissue abnormality is noted. IMPRESSION: Chronic changes without acute abnormality.  Electronically Signed   By: Alcide Clever M.D.   On: 01/16/2021 20:43   CT HEAD WO CONTRAST  Result Date: 01/16/2021 CLINICAL DATA:  Mental status change, unknown cause.  Fall. EXAM: CT HEAD WITHOUT CONTRAST TECHNIQUE: Contiguous axial images were obtained from the base of the skull through the vertex without intravenous contrast. COMPARISON:  None. FINDINGS: Brain: No acute intracranial abnormality. Specifically, no hemorrhage, hydrocephalus, mass lesion, acute infarction, or significant intracranial injury. Vascular: No hyperdense vessel or unexpected calcification. Skull: No acute calvarial abnormality. Sinuses/Orbits: No acute findings Other: None IMPRESSION: No acute intracranial abnormality. Electronically Signed   By: Charlett Nose M.D.   On: 01/16/2021 20:48    Procedures Procedures   Medications Ordered in ED Medications  ketorolac (TORADOL) injection 30 mg (30 mg Intramuscular Given 01/16/21 2000)  lactated ringers bolus 1,000 mL (0 mLs Intravenous Stopped 01/16/21 2200)    ED Course  I have reviewed the triage vital signs and the nursing notes.  Pertinent labs & imaging results that were available during my care of the patient were reviewed by me and considered in my medical decision making (see chart for details).    MDM Rules/Calculators/A&P                          Patient is a 65 year old male with history of homelessness and substance abuse presenting via EMS after reported fall and right ankle pain.  On arrival in the ED, his vital signs are normal.  On initial assessment, patient is sleeping.  He is arousable but does seem somnolent.  When asked why he is so tired, he states that he has not been sleeping well in the homeless shelter where he currently resides.  He does endorse ankle pain and tenderness.  He does not remember the fall.  He also endorses pain in his mid back which he says is chronic.  X-ray imaging of ankle was obtained.  Given his somnolence, laboratory  work-up and CT of head was ordered.  Results of work-up were reassuring.  No acute osseous or joint abnormalities were identified on ankle x-ray.  Patient was allowed to continue to rest in the ED.  After 5 hours of observation, patient was offered something to eat and drink.  He was able to tolerate p.o. intake.  At that point, he requested to be discharged.  Patient was discharged in stable condition.  Final Clinical Impression(s) / ED Diagnoses Final diagnoses:  Fall, initial encounter    Rx / DC Orders ED Discharge Orders     None        Gloris Manchester, MD 01/18/21 1436

## 2021-01-16 NOTE — ED Triage Notes (Signed)
Brought by ems.  Patient reports pain to whole body.  Reports he stopped methadone 3 weeks ago and has felt  bad since.  Does endorses having 2 beers today.

## 2021-01-16 NOTE — ED Triage Notes (Signed)
Arrived via EMS secondary to a fall, injury to Rt Ankle, per EMS has minor swelling. Does not complain of pain at this time. Pt states he had several beers prior to incident. VSS, CBG 66, treated by EMS, CBG reck now at 96mg /dl. GCS 15, being appropriate

## 2021-01-17 ENCOUNTER — Emergency Department (HOSPITAL_COMMUNITY): Payer: Medicaid Other

## 2021-01-17 ENCOUNTER — Emergency Department (HOSPITAL_COMMUNITY)
Admission: EM | Admit: 2021-01-17 | Discharge: 2021-01-18 | Disposition: A | Discharge status: Home / Self Care | Payer: Medicaid Other | Source: Home / Self Care

## 2021-01-17 ENCOUNTER — Other Ambulatory Visit: Payer: Self-pay

## 2021-01-17 ENCOUNTER — Encounter (HOSPITAL_COMMUNITY): Payer: Self-pay

## 2021-01-17 DIAGNOSIS — M549 Dorsalgia, unspecified: Secondary | ICD-10-CM | POA: Insufficient documentation

## 2021-01-17 DIAGNOSIS — Z5321 Procedure and treatment not carried out due to patient leaving prior to being seen by health care provider: Secondary | ICD-10-CM | POA: Insufficient documentation

## 2021-01-17 NOTE — ED Triage Notes (Signed)
Patient BIB [facility] from homeless shelter. On scene he is complaining of back and left shoulder pain. He took 2 pills of Vicodin at 1pm.

## 2021-01-17 NOTE — ED Provider Notes (Addendum)
Emergency Medicine Provider Triage Evaluation Note  William Robles , a 65 y.o. male  was evaluated in triage.  Pt complains of lower back pain.  States that yesterday he was walking and tripped over unknown object.  States that since then he has had lower back pain.  He denies weakness, incontinence of bowel or bladder, fevers, IV drug use.  He is quite drowsy on exam and needs multiple commands to answer my questions.  Per nursing he took 2 Vicodin at 1 PM today.  Does have limping gait but able to bear weight on bilateral legs.  He also complains of pain with urinating and difficulty urinating which has preceded this fall by "months."  Review of Systems  Positive: Low back pain Negative: Fevers  Physical Exam  BP 130/78   Pulse 78   Temp (!) 97.5 F (36.4 C)   Resp 16   SpO2 96%  Gen:   Awake, no distress   Resp:  Normal effort  MSK:   Moves extremities without difficulty  Other:  Limping gait.  Strength 4/5 bilateral lower extremities.  Sensation intact.  Tenderness to palpation of L-spine, bilateral paraspinal muscles.  Medical Decision Making  Medically screening exam initiated at 10:50 PM.  Appropriate orders placed.  Paulla Dolly was informed that the remainder of the evaluation will be completed by another provider, this initial triage assessment does not replace that evaluation, and the importance of remaining in the ED until their evaluation is complete.     Cristopher Peru, PA-C 01/17/21 2253    Cristopher Peru, PA-C 01/17/21 2253    Charlynne Pander, MD 01/17/21 [phone]

## 2021-01-17 NOTE — ED Notes (Addendum)
Pt agitated when given discharge papers, stated he has overslept his opportunity at [employer]. Taxi voucher provided for transport near [facility]. D/C papers given. Escorted out with security

## 2021-01-18 LAB — BASIC METABOLIC PANEL
Anion gap: 8 (ref 5–15)
BUN: 35 mg/dL — ABNORMAL HIGH (ref 8–23)
CO2: 29 mmol/L (ref 22–32)
Calcium: 9.3 mg/dL (ref 8.9–10.3)
Chloride: 100 mmol/L (ref 98–111)
Creatinine, Ser: 1.16 mg/dL (ref 0.61–1.24)
GFR, Estimated: >60 mL/min (ref >60)
Glucose, Bld: 107 mg/dL — ABNORMAL HIGH (ref 70–99)
Potassium: 3.4 mmol/L — ABNORMAL LOW (ref 3.5–5.1)
Sodium: 137 mmol/L (ref 135–145)

## 2021-01-18 LAB — CBC WITH DIFFERENTIAL/PLATELET
Abs Immature Granulocytes: 0.02 10*3/uL (ref 0.00–0.07)
Basophils Absolute: 0 10*3/uL (ref 0.0–0.1)
Basophils Relative: 0 %
Eosinophils Absolute: 0.3 10*3/uL (ref 0.0–0.5)
Eosinophils Relative: 4 %
HCT: 35.3 % — ABNORMAL LOW (ref 39.0–52.0)
Hemoglobin: 12.2 g/dL — ABNORMAL LOW (ref 13.0–17.0)
Immature Granulocytes: 0 %
Lymphocytes Relative: 27 %
Lymphs Abs: 1.7 10*3/uL (ref 0.7–4.0)
MCH: 31.6 pg (ref 26.0–34.0)
MCHC: 34.6 g/dL (ref 30.0–36.0)
MCV: 91.5 fL (ref 80.0–100.0)
Monocytes Absolute: 0.6 10*3/uL (ref 0.1–1.0)
Monocytes Relative: 9 %
Neutro Abs: 3.8 10*3/uL (ref 1.7–7.7)
Neutrophils Relative %: 60 %
Platelets: 148 10*3/uL — ABNORMAL LOW (ref 150–400)
RBC: 3.86 MIL/uL — ABNORMAL LOW (ref 4.22–5.81)
RDW: 13.3 % (ref 11.5–15.5)
WBC: 6.4 10*3/uL (ref 4.0–10.5)
nRBC: 0 % (ref 0.0–0.2)

## 2021-01-18 NOTE — ED Notes (Addendum)
Xray technician came to triage and stated when she went to the lobby she said the patient fell while he was sleeping as he dosed off to the side. NT, Dee and Nettie Elm went to see patient and he was holding his head. RN assessed patient, no change in mental status, no abrasions noted to head. RN told Melvenia Beam, [place] face to face. She is coming to assess the patient in triage. Vital signs taken.

## 2021-01-18 NOTE — ED Notes (Signed)
Patient said he was ready to leave. Melvenia Beam, PA notified.

## 2021-01-18 NOTE — ED Provider Notes (Signed)
I was called to triage to evaluate this patient after he fell in the lobby while waiting to be seen. He was apparently sleeping in a chair and fell out onto the floor while asleep. He reports he hit his head.   He reports he came to the ED for evaluation of fall yesterday after tripping on a yard sign. He fell in the grass. He reports pain all over.   The patient tells me he wants to leave. He states he does not want to be seen or evaluated. VSS. No visible sign of head trauma. He is oriented and alert and is felt capable of making his own care decisions.   I asked him a second time if he wanted to be evaluated and/or treated and he stated he did not want to be seen after such a long wait time and he was leaving.    Elpidio Anis, PA-C 01/18/21 6789    Paula Libra, MD 01/18/21 [phone]

## 2023-04-02 IMAGING — CR DG LUMBAR SPINE COMPLETE 4+V
5 series · 5 of 5 positions shown · non-contrast
Comparison: CT abdomen and pelvis 01/07/2010.

CLINICAL DATA: Chronic lower back pain.

EXAM:
LUMBAR SPINE - COMPLETE 4+ VIEW

[t lumbar spine ap]
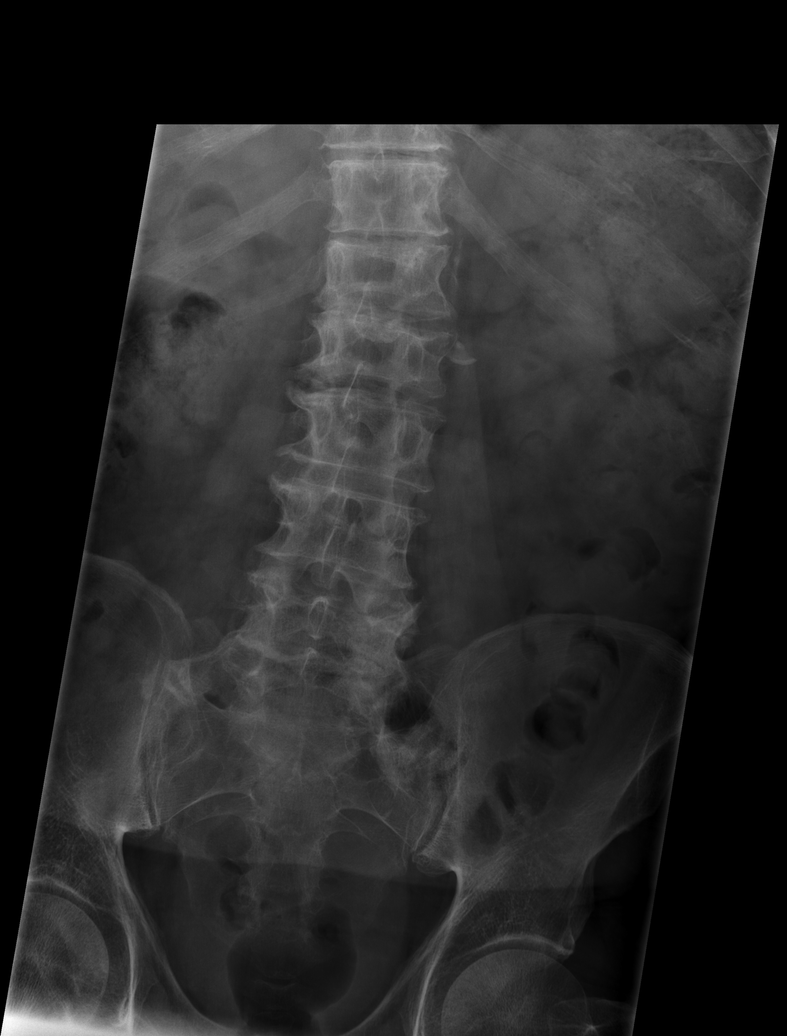

[t lumbar spine obl (1 of 2)]
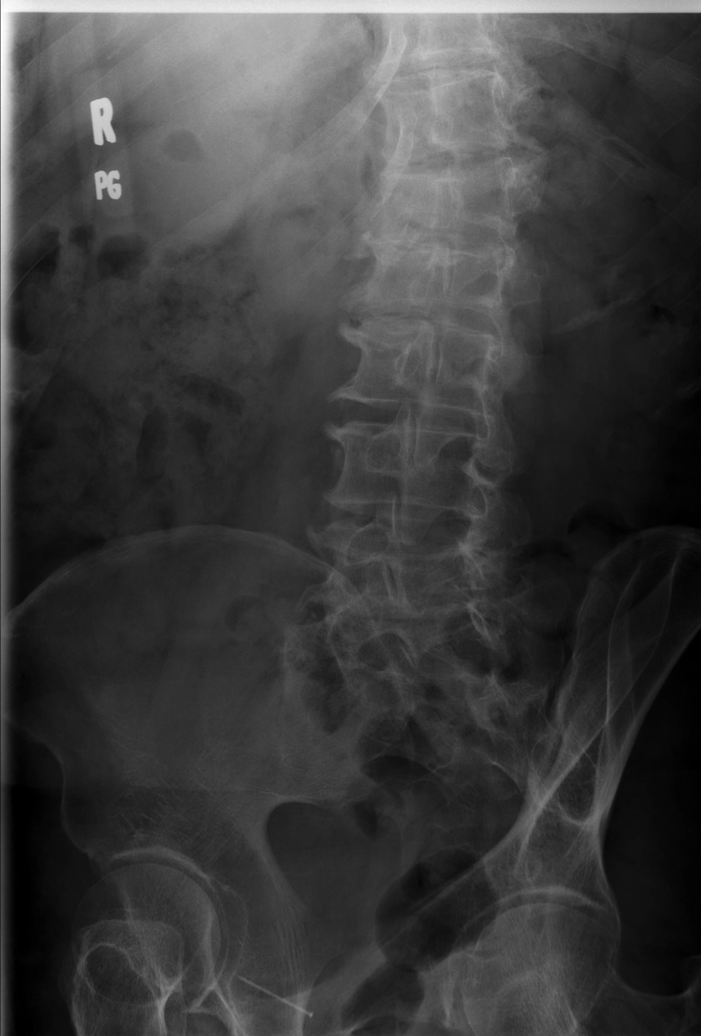

[t lumbar spine obl (2 of 2)]
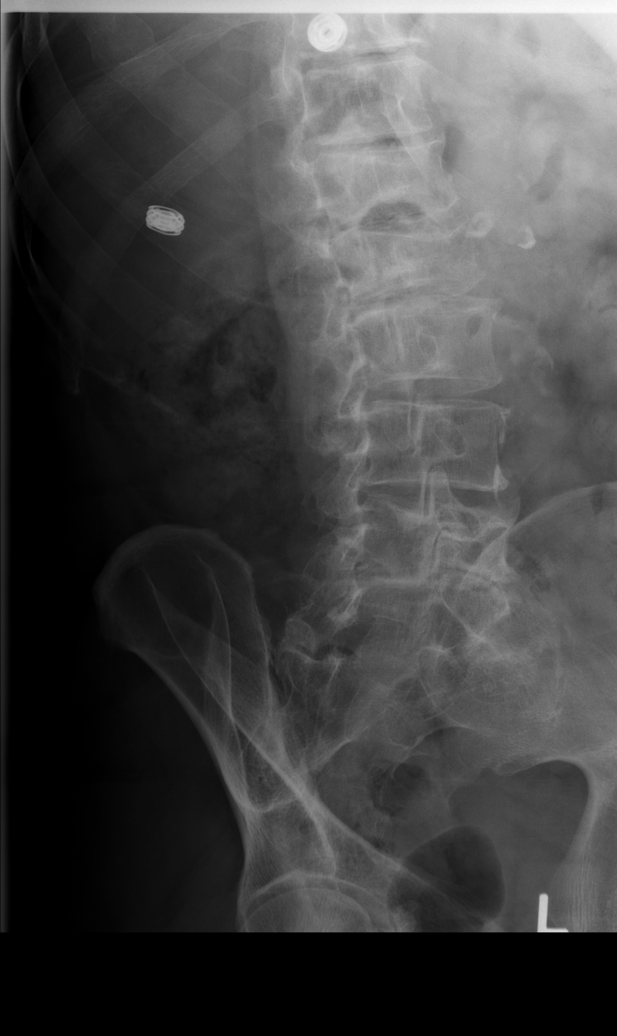

[t lumbar spine lat]
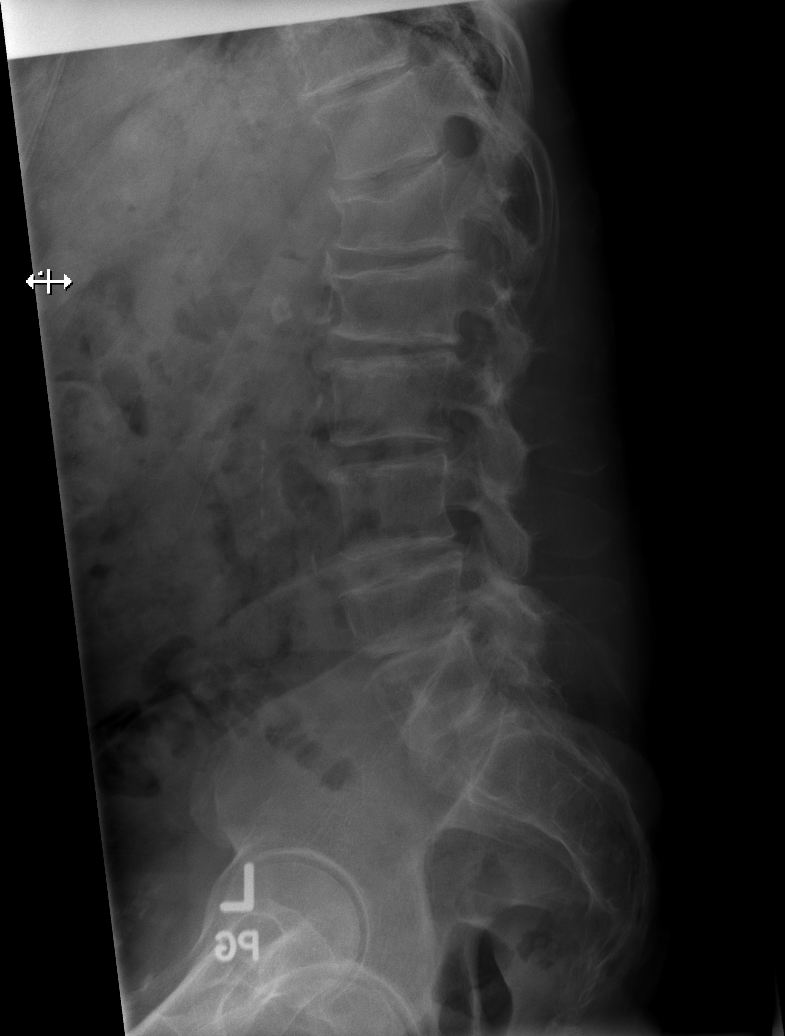

[t lumbar l-5 s-1 spot]
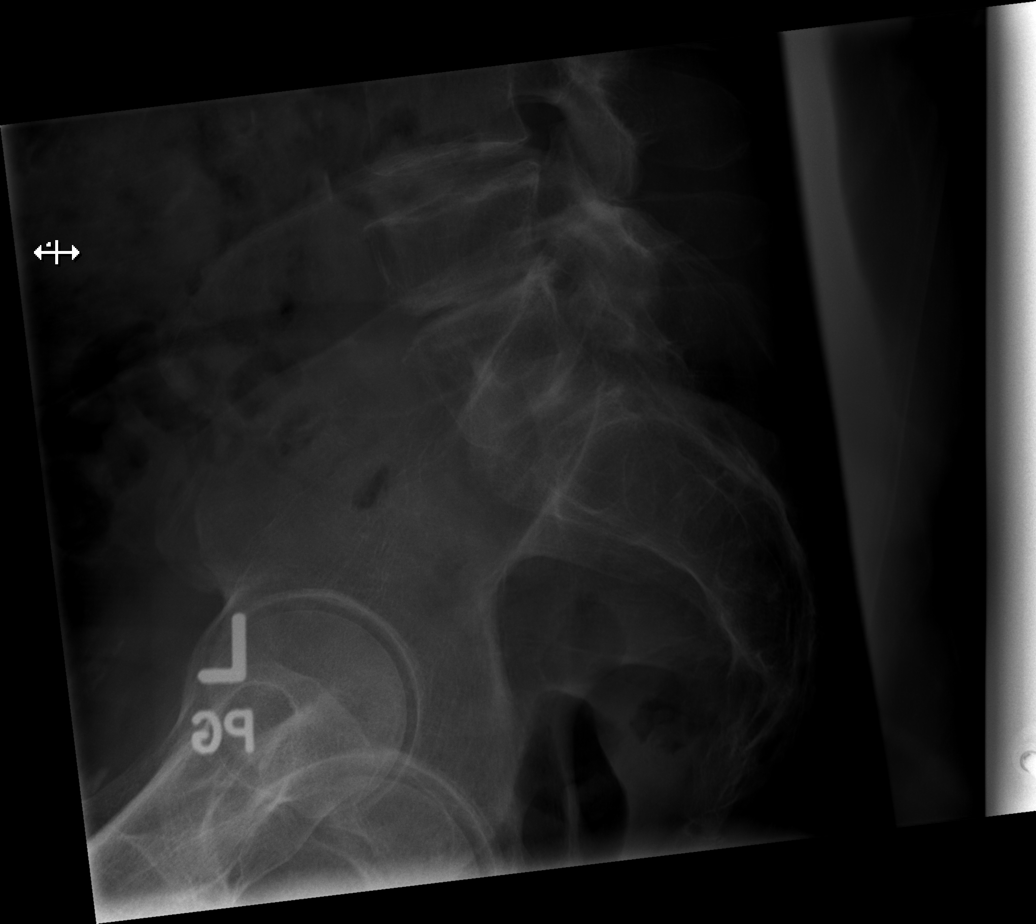

[5 of 5 positions shown; findings below may reference images not displayed]

FINDINGS: Mild compression deformities of L4 and L1 appears similar to the
prior study. There is 25% compression deformity superior endplate of
T12 which is new from 6266, but favored as chronic. Spinal alignment
is within normal limits. There is disc space narrowing at L4-L5 and
L5-S1 compatible with degenerative change. Mild degenerative
endplate osteophytes are seen throughout the lower thoracic and
lumbar spine.

There are vascular calcifications in the soft tissues.
IMPRESSION: 1. No acute fracture or malalignment of the lumbar spine.
2. 25% compression fracture of T12 is new from 6266, but favored as
chronic. Please correlate for point tenderness.
3. Mild degenerative changes.
